# Patient Record
Sex: Female | Born: 1975 | Race: White | Hispanic: No | Marital: Single | Smoking: Never smoker
Health system: Southern US, Community
[De-identification: ages and names within clinical notes are randomized; demographics above are authoritative.]

## PROBLEM LIST (undated history)

## (undated) DIAGNOSIS — J302 Other seasonal allergic rhinitis: Secondary | ICD-10-CM

## (undated) HISTORY — PX: BRAIN SURGERY: SHX531

---

## 2013-01-28 ENCOUNTER — Emergency Department
Admission: EM | Admit: 2013-01-28 | Discharge: 2013-01-28 | Disposition: A | Discharge status: Home / Self Care | Payer: BC Managed Care – PPO | Source: Home / Self Care

## 2013-01-28 ENCOUNTER — Encounter: Payer: Self-pay | Admitting: Emergency Medicine

## 2013-01-28 DIAGNOSIS — H60399 Other infective otitis externa, unspecified ear: Secondary | ICD-10-CM

## 2013-01-28 DIAGNOSIS — H60391 Other infective otitis externa, right ear: Secondary | ICD-10-CM

## 2013-01-28 MED ORDER — NEOMYCIN-POLYMYXIN-HC 3.5-10000-1 OT SOLN: 3.0000 [drp] | Freq: Four times a day (QID) | OTIC | Status: AC

## 2013-01-28 NOTE — ED Provider Notes (Signed)
History     CSN: [id number]  Arrival date & time 01/28/13  1522   None     Chief Complaint  Patient presents with  . Ear Problem   HPI  Right ear discomfort x1 week. Patient said cessation of ear being clogged. No headache. Has had mild allergic/your eye symptoms. No neck stiffness. Patient is a nonsmoker. Has not been taking allergy medicine.  History reviewed. No pertinent past medical history.  Past Surgical History  Procedure Laterality Date  . Brain surgery      No family history on file.  History  Substance Use Topics  . Smoking status: Never Smoker   . Smokeless tobacco: Not on file  . Alcohol Use: No    OB History   Grav Para Term Preterm Abortions TAB SAB Ect Mult Living                  Review of Systems  All other systems reviewed and are negative.    Allergies  Review of patient's allergies indicates no known allergies.  Home Medications   Current Outpatient Rx  Name  Route  Sig  Dispense  Refill  . neomycin-polymyxin-hydrocortisone (CORTISPORIN) otic solution   Right Ear   Place 3 drops into the right ear 4 (four) times daily.   10 mL   0     BP 114/78  Pulse 92  Temp(Src) 97.3 F (36.3 C) (Oral)  Ht 5\' 5"  (1.651 m)  Wt 131 lb (59.421 kg)  BMI 21.8 kg/m2  SpO2 100%  LMP 01/15/2013  Physical Exam  Constitutional: She appears well-developed and well-nourished.  HENT:  Head: Normocephalic and atraumatic.  Left Ear: External ear normal.  R ear mild erythema and tenderness to otoscopic evaluation   Eyes: Pupils are equal, round, and reactive to light.  Neck: Normal range of motion.  Cardiovascular: Normal rate, regular rhythm and normal heart sounds.   Pulmonary/Chest: Effort normal.  Abdominal: Soft.  Musculoskeletal: Normal range of motion.  Lymphadenopathy:    She has no cervical adenopathy.  Neurological: She is alert.  Skin: Skin is warm.    ED Course  Procedures (including critical care time)  Labs Reviewed -  No data to display No results found.   1. Otitis, externa, infective, right       MDM  Will treat with cortisporin otic.  Discussed supportive care and ENT red flags.  Follow up with ENT if ear discomfort persists.  Start daily antihistamine for allergies.  Follow up as needed.      The patient and/or caregiver has been counseled thoroughly with regard to treatment plan and/or medications prescribed including dosage, schedule, interactions, rationale for use, and possible side effects and they verbalize understanding. Diagnoses and expected course of recovery discussed and will return if not improved as expected or if the condition worsens. Patient and/or caregiver verbalized understanding.             Doree Albee, MD 01/28/13 1556

## 2013-01-28 NOTE — ED Notes (Signed)
Rt ear, clogged x 1 week

## 2013-04-16 ENCOUNTER — Encounter: Payer: Self-pay | Admitting: Emergency Medicine

## 2013-04-16 ENCOUNTER — Emergency Department
Admission: EM | Admit: 2013-04-16 | Discharge: 2013-04-16 | Disposition: A | Discharge status: Home / Self Care | Payer: BC Managed Care – PPO | Source: Home / Self Care | Attending: Family Medicine | Admitting: Family Medicine

## 2013-04-16 DIAGNOSIS — H6980 Other specified disorders of Eustachian tube, unspecified ear: Secondary | ICD-10-CM

## 2013-04-16 DIAGNOSIS — H6981 Other specified disorders of Eustachian tube, right ear: Secondary | ICD-10-CM

## 2013-04-16 HISTORY — DX: Other seasonal allergic rhinitis: J30.2

## 2013-04-16 MED ORDER — PREDNISONE 20 MG PO TABS: 20.0000 mg | ORAL_TABLET | Freq: Two times a day (BID) | ORAL | Status: DC

## 2013-04-16 NOTE — ED Notes (Signed)
Reports sensation of right ear fullness; no pain; wondered if allergy related.

## 2013-04-16 NOTE — ED Provider Notes (Signed)
History     CSN: [id number]  Arrival date & time 04/16/13  1324   First MD Initiated Contact with Patient 04/16/13 [phone]      Chief Complaint  Patient presents with  . Ear Fullness       HPI Comments: Patient complains of persistent sensation of her right ear being clogged, without pain.  She has noticed decreased hearing in her right ear.  No recent URI symptoms.  No drainage from right ear.  No fevers, chills, and sweats  She has a history of seasonal allergies which are not symptomatic at present.  She has a history of recurrent right otitis media as a teen-ager.  Patient is a 37 y.o. female presenting with plugged ear sensation. The history is provided by the patient.  Ear Fullness This is a chronic problem. The current episode started more than 1 week ago. The problem occurs constantly. The problem has not changed since onset.Associated symptoms comments: none. Nothing aggravates the symptoms. Nothing relieves the symptoms. Treatments tried: antihistamine. The treatment provided no relief.    Past Medical History  Diagnosis Date  . Seasonal allergies     Past Surgical History  Procedure Laterality Date  . Brain surgery    . Brain surgery      History reviewed. No pertinent family history.  History  Substance Use Topics  . Smoking status: Never Smoker   . Smokeless tobacco: Not on file  . Alcohol Use: No    OB History   Grav Para Term Preterm Abortions TAB SAB Ect Mult Living                  Review of Systems  All other systems reviewed and are negative.    Allergies  Review of patient's allergies indicates no known allergies.  Home Medications   Current Outpatient Rx  Name  Route  Sig  Dispense  Refill  . predniSONE (DELTASONE) 20 MG tablet   Oral   Take 1 tablet (20 mg total) by mouth 2 (two) times daily. Take with food.   10 tablet   0     BP 98/65  Pulse 85  Temp(Src) 98 F (36.7 C) (Oral)  Resp 16  Ht 5\' 5"  (1.651 m)  Wt 128 lb (58.06  kg)  BMI 21.3 kg/m2  SpO2 100%  LMP 03/21/2013  Physical Exam Nursing notes and Vital Signs reviewed. Appearance:  Patient appears healthy, stated age, and in no acute distress Eyes:  Pupils are equal, round, and reactive to light and accomodation.  Extraocular movement is intact.  Conjunctivae are not inflamed  Ears:  Canals normal.  Right tympanic membrane is scarred but shows no evidence otitis media.  Left tympanic membrane normal Nose:  Mildly congested turbinates, worse on the right.  No sinus tenderness.  Pharynx:  Normal Neck:  Supple.  No adenopathy. Skin:  No rash present.    ED Course  Procedures  none  Labs Reviewed -  Tympanogram positive peak pressure left ear; normal right ear     1. Eustachian tube dysfunction, right       MDM   Begin prednisone burst. Take Sudafed for sinus congestion.  Increase fluid intake  May use Afrin nasal spray (or generic oxymetazoline) twice daily for about 5 days.  Also recommend using saline nasal spray several times daily and saline nasal irrigation (AYR is a common brand) Followup with ENT if not improved in one week.        Jeannett Senior  Jillyn Hidden, MD 04/16/13 [phone]

## 2015-09-17 ENCOUNTER — Encounter: Payer: Self-pay | Admitting: *Deleted

## 2015-09-17 ENCOUNTER — Emergency Department
Admission: EM | Admit: 2015-09-17 | Discharge: 2015-09-17 | Disposition: A | Discharge status: Home / Self Care | Payer: BLUE CROSS/BLUE SHIELD | Source: Home / Self Care | Attending: Family Medicine | Admitting: Family Medicine

## 2015-09-17 DIAGNOSIS — H01003 Unspecified blepharitis right eye, unspecified eyelid: Secondary | ICD-10-CM

## 2015-09-17 DIAGNOSIS — J029 Acute pharyngitis, unspecified: Secondary | ICD-10-CM | POA: Diagnosis not present

## 2015-09-17 MED ORDER — BACITRACIN-POLYMYXIN B 500-10000 UNIT/GM OP OINT: 1.0000 "application " | TOPICAL_OINTMENT | Freq: Two times a day (BID) | OPHTHALMIC | Status: DC

## 2015-09-17 NOTE — Discharge Instructions (Signed)
You may try warm compresses and benadryl to help with itching and redness. If symptoms not improving in 24-48 hours, you may start using the antibiotic ointment.     Blepharitis Blepharitis is inflammation of the eyelids. Blepharitis may happen with:  Reddish, scaly skin around the scalp and eyebrows.  Burning or itching of the eyelids.  Eye discharge at night that causes the eyelashes to stick together in the morning.  Eyelashes that fall out.  Sensitivity to light. HOME CARE INSTRUCTIONS Pay attention to any changes in how you look or feel. Follow these instructions to help with your condition: Keeping Clean  Wash your hands often.  Wash your eyelids with warm water or with warm water that is mixed with a small amount of baby shampoo. Do this two times per day or as often as needed.  Wash your face and eyebrows at least once a day.  Use a clean towel each time you dry your eyelids. Do not use this towel to clean or dry other areas of your body. Do not share your towel with anyone. General Instructions  Avoid wearing makeup until you get better. Do not share makeup with anyone.  Avoid rubbing your eyes.  Apply warm compresses to your eyes 2 times per day for 10 minutes at a time, or as told by your health care provider.  If you were prescribed an antibiotic ointment or steroid drops, apply or use the medicine as told by your health care provider. Do not stop using the medicine even if you feel better.  Keep all follow-up visits as told by your health care provider. This is important. SEEK MEDICAL CARE IF:  Your eyelids feel hot.  You have blisters or a rash on your eyelids.  The condition does not go away in 2-4 days.  The inflammation gets worse. SEEK IMMEDIATE MEDICAL CARE IF:  You have pain or redness that gets worse or spreads to other parts of your face.  Your vision changes.  You have pain when looking at lights or moving objects.  You have a fever.     This information is not intended to replace advice given to you by your health care provider. Make sure you discuss any questions you have with your health care provider.   Document Released: 10/21/2000 Document Revised: 07/15/2015 Document Reviewed: 02/16/2015 Elsevier Interactive Patient Education [employer]2016 [employer]

## 2015-09-17 NOTE — ED Provider Notes (Signed)
CSN: [id number]     Arrival date & time 09/17/15  1502 History   First MD Initiated Contact with Patient 09/17/15 1520     Chief Complaint  Patient presents with  . Eye Problem   (Consider location/radiation/quality/duration/timing/severity/associated sxs/prior Treatment) HPI Pt is a 39yo female presenting to [facility] with c/o Right lower eyelid soreness with mild redness and swelling this morning.  She denies injury to her eye. She has had a mild intermittent sore throat with mild congestion for 2-3 days.  Pt states she has children who have also been sick recently. Denies fever, chills, n/v/d. Denies change in vision. She does not wear contacts.   Past Medical History  Diagnosis Date  . Seasonal allergies    Past Surgical History  Procedure Laterality Date  . Brain surgery    . Brain surgery     History reviewed. No pertinent family history. Social History  Substance Use Topics  . Smoking status: Never Smoker   . Smokeless tobacco: None  . Alcohol Use: No   OB History    No data available     Review of Systems  Constitutional: Negative for fever and chills.  HENT: Positive for congestion and sore throat. Negative for ear discharge, ear pain, trouble swallowing and voice change.   Eyes: Positive for pain, discharge, redness and itching. Negative for photophobia and visual disturbance.       Right lower eyelid  Respiratory: Negative for cough and shortness of breath.   Gastrointestinal: Negative for nausea, vomiting, abdominal pain and diarrhea.  Neurological: Negative for dizziness, light-headedness and headaches.    Allergies  Review of patient's allergies indicates no known allergies.  Home Medications   Prior to Admission medications   Medication Sig Start Date End Date Taking? Authorizing Provider  bacitracin-polymyxin b (POLYSPORIN) ophthalmic ointment Place 1 application into the right eye every 12 (twelve) hours. apply to Right lower eyelid every 12 hours while  awake for 5 days 09/17/15   Junius Finner, PA-C   Meds Ordered and Administered this Visit  Medications - No data to display  BP 144/78 mmHg  Pulse 100  Temp(Src) 98.1 F (36.7 C) (Oral)  Resp 14  Wt 139 lb (63.05 kg)  SpO2 100%  LMP 08/27/2015 No data found.   Physical Exam  Constitutional: She is oriented to person, place, and time. She appears well-developed and well-nourished.  HENT:  Head: Normocephalic and atraumatic.  Right Ear: Hearing, tympanic membrane, external ear and ear canal normal.  Left Ear: Hearing, tympanic membrane, external ear and ear canal normal.  Nose: Nose normal.  Mouth/Throat: Uvula is midline, oropharynx is clear and moist and mucous membranes are normal.  Eyes: Conjunctivae and EOM are normal. Pupils are equal, round, and reactive to light. Lids are everted and swept, no foreign bodies found. Right eye exhibits discharge. Right eye exhibits no hordeolum.  Right lower eyelid: mild erythema and edema, scant white discharge in medial canthus. Minimal tenderness. No bleeding.  EOM normal. Normal conjunctiva.  Neck: Normal range of motion. Neck supple.  Cardiovascular: Normal rate.   Pulmonary/Chest: Effort normal.  Musculoskeletal: Normal range of motion.  Neurological: She is alert and oriented to person, place, and time.  Skin: Skin is warm and dry.  Psychiatric: She has a normal mood and affect. Her behavior is normal.  Nursing note and vitals reviewed.   ED Course  Procedures (including critical care time)  Labs Review Labs Reviewed - No data to display  Imaging Review No  results found.   Visual Acuity Review  Right Eye Distance: 20/20 Left Eye Distance: 20/40 Bilateral Distance: 20/20 (glasses)    MDM   1. Blepharitis of right eyelid   2. Sore throat    Pt is a 39yo female [facility] c/o Right lower eyelid irritation. Exam c/w early onset blepharitis.  No evidence of periorbital cellulitis at this time. Encouraged  warm compresses and taking Benadryl to see if symptoms improve in 24-48 hours. If not improving, or symptoms worsening, she may use topical antibiotic. Rx: polysporin ophthalmic ointment Home care instructions provided. F/u with PCP in 4-5 days, sooner if worsening. Patient verbalized understanding and agreement with treatment plan.  Junius Finnerrin O'Malley, PA-C 09/17/15 1549

## 2015-09-17 NOTE — ED Notes (Signed)
Pt c/o awakening with right eye soreness and swelling x this AM. Denies any drainage.

## 2015-09-19 ENCOUNTER — Encounter: Payer: Self-pay | Admitting: Emergency Medicine

## 2015-09-19 ENCOUNTER — Emergency Department
Admission: EM | Admit: 2015-09-19 | Discharge: 2015-09-19 | Disposition: A | Discharge status: Home / Self Care | Payer: BLUE CROSS/BLUE SHIELD | Source: Home / Self Care | Attending: Family Medicine | Admitting: Family Medicine

## 2015-09-19 DIAGNOSIS — B9789 Other viral agents as the cause of diseases classified elsewhere: Principal | ICD-10-CM

## 2015-09-19 DIAGNOSIS — J069 Acute upper respiratory infection, unspecified: Secondary | ICD-10-CM | POA: Diagnosis not present

## 2015-09-19 LAB — POCT RAPID STREP A (OFFICE): RAPID STREP A SCREEN: NEGATIVE

## 2015-09-19 MED ORDER — AZITHROMYCIN 250 MG PO TABS: ORAL_TABLET | ORAL | Status: DC

## 2015-09-19 MED ORDER — PREDNISOLONE SODIUM PHOSPHATE 15 MG/5ML PO SOLN: ORAL | Status: DC

## 2015-09-19 NOTE — ED Notes (Signed)
Reports sore throat and hoarseness for past 48 hours. No OTC today.

## 2015-09-19 NOTE — Discharge Instructions (Signed)
Take plain guaifenesin (such as Robitussin syrup, or1200mg  extended release tabs such as Mucinex) twice daily, with plenty of water, for cough and congestion.  May add Pseudoephedrine for sinus congestion.  Get adequate rest.   May use Afrin nasal spray (or generic oxymetazoline) twice daily for about 5 days and then discontinue.  Also recommend using saline nasal spray several times daily and saline nasal irrigation (AYR is a common brand).   Try warm salt water gargles for sore throat.  Stop all antihistamines for now, and other non-prescription cough/cold preparations.  May take Delsym Cough Suppressant at bedtime for nighttime cough.  Begin Azithromycin if not improving about one week or if persistent fever develops   Follow-up with family doctor if not improving about10 days.

## 2015-09-19 NOTE — ED Provider Notes (Signed)
CSN: [id number]     Arrival date & time 09/19/15  1208 History   First MD Initiated Contact with Patient 09/19/15 1248     Chief Complaint  Patient presents with  . Sore Throat  . Hoarse      HPI Comments: Three days ago patient developed a sore throat, post nasal drainage, and fatigue.  Yesterday she became hoarse and developed a cough.  Today she developed headache.  No fevers, chills, and sweats.  The history is provided by the patient.    Past Medical History  Diagnosis Date  . Seasonal allergies    Past Surgical History  Procedure Laterality Date  . Brain surgery    . Brain surgery     History reviewed. No pertinent family history. Social History  Substance Use Topics  . Smoking status: Never Smoker   . Smokeless tobacco: None  . Alcohol Use: No   OB History    No data available     Review of Systems + sore throat + hoarse + cough No pleuritic pain No wheezing No nasal congestion + post-nasal drainage No sinus pain/pressure No itchy/red eyes ? earache No hemoptysis No SOB No fever/chills No nausea No vomiting No abdominal pain No diarrhea No urinary symptoms No skin rash + fatigue No myalgias + headache    Allergies  Review of patient's allergies indicates no known allergies.  Home Medications   Prior to Admission medications   Medication Sig Start Date End Date Taking? Authorizing Provider  azithromycin (ZITHROMAX Z-PAK) 250 MG tablet Take 2 tabs today; then begin one tab once daily for 4 more days. (Rx void after 09/27/15) 09/19/15   Lattie HawStephen A Beese, MD  prednisoLONE (ORAPRED) 15 MG/5ML solution Take 5mL by mouth twice daily for 5 days. 09/19/15   Lattie HawStephen A Beese, MD   Meds Ordered and Administered this Visit  Medications - No data to display  BP 119/78 mmHg  Pulse 125  Temp(Src) 100.8 F (38.2 C) (Oral)  Ht 5\' 9"  (1.753 m)  Wt 139 lb (63.05 kg)  BMI 20.52 kg/m2  SpO2 98%  LMP 08/27/2015 No data found.   Physical Exam Nursing  notes and Vital Signs reviewed. Appearance:  Patient appears stated age, and in no acute distress Eyes:  Pupils are equal, round, and reactive to light and accomodation.  Extraocular movement is intact.  Conjunctivae are not inflamed  Ears:  Canals normal.  Tympanic membranes normal.  Nose:  Mildly congested turbinates.  No sinus tenderness.    Pharynx:  Mildly erythematous Neck:  Supple.  Tender enlarged posterior nodes are palpated bilaterally  Lungs:  Clear to auscultation.  Breath sounds are equal.  Moving air well. Heart:  Regular rate and rhythm without murmurs, rubs, or gallops.  Abdomen:  Nontender without masses or hepatosplenomegaly.  Bowel sounds are present.  No CVA or flank tenderness.  Extremities:  No edema.  No calf tenderness Skin:  No rash present.   ED Course  Procedures none    Labs Reviewed  STREP A DNA PROBE  POCT RAPID STREP A (OFFICE) negative     MDM   1. Viral URI with cough    There is no evidence of bacterial infection today.  Throat culture pending. Begin prednisone burst. Take plain guaifenesin (such as Robitussin syrup, or1200mg  extended release tabs such as Mucinex) twice daily, with plenty of water, for cough and congestion.  May add Pseudoephedrine for sinus congestion.  Get adequate rest.   May use Afrin  nasal spray (or generic oxymetazoline) twice daily for about 5 days and then discontinue.  Also recommend using saline nasal spray several times daily and saline nasal irrigation (AYR is a common brand).   Try warm salt water gargles for sore throat.  Stop all antihistamines for now, and other non-prescription cough/cold preparations.  May take Delsym Cough Suppressant at bedtime for nighttime cough.  Begin Azithromycin if not improving about one week or if persistent fever develops (Given a prescription to hold, with an expiration date)  Follow-up with family doctor if not improving about10 days.     Lattie Haw, MD 09/24/15 [phone]

## 2015-09-20 LAB — STREP A DNA PROBE: GASP: NEGATIVE

## 2015-09-22 ENCOUNTER — Telehealth: Payer: Self-pay | Admitting: *Deleted

## 2020-07-08 HISTORY — PX: THYROID SURGERY: SHX805

## 2020-09-18 ENCOUNTER — Other Ambulatory Visit: Payer: Self-pay

## 2020-09-18 ENCOUNTER — Emergency Department
Admission: EM | Admit: 2020-09-18 | Discharge: 2020-09-18 | Disposition: A | Discharge status: Home / Self Care | Payer: BLUE CROSS/BLUE SHIELD | Source: Home / Self Care | Attending: Family Medicine | Admitting: Family Medicine

## 2020-09-18 ENCOUNTER — Emergency Department (INDEPENDENT_AMBULATORY_CARE_PROVIDER_SITE_OTHER): Payer: BLUE CROSS/BLUE SHIELD

## 2020-09-18 DIAGNOSIS — M79604 Pain in right leg: Secondary | ICD-10-CM | POA: Diagnosis not present

## 2020-09-18 DIAGNOSIS — M545 Low back pain, unspecified: Secondary | ICD-10-CM | POA: Diagnosis not present

## 2020-09-18 DIAGNOSIS — M47816 Spondylosis without myelopathy or radiculopathy, lumbar region: Secondary | ICD-10-CM

## 2020-09-18 MED ORDER — PREDNISONE 20 MG PO TABS: ORAL_TABLET | ORAL | 0 refills | Status: AC

## 2020-09-18 NOTE — ED Triage Notes (Signed)
Pt c/o RT sided lower back pain as well as cramping in RT leg, currently behind RT knee. Last night states that it cramped all night. Ibuprofen prn. Pain 2/10

## 2020-09-18 NOTE — Discharge Instructions (Signed)
Take one prednisone tablet today. Apply ice pack to right lower back for 20 to 30 minutes, 3 to 4 times daily  Continue until pain and swelling decrease.  After finishing prednisone, may take Ibuprofen 200mg , 4 tabs every 8 hours with food.

## 2020-09-18 NOTE — ED Provider Notes (Signed)
[facility]    CSN: [id number] Arrival date & time: 09/18/20  1258      History   Chief Complaint Chief Complaint  Patient presents with  . Back Pain  . Leg cramps    RT    HPI Alyssa Davis is a 44 y.o. female.   Patient has a long history of recurring right lower back pain.  Last night she developed cramping pain radiating into her right posterior leg.  She denies injury but attributes her pain to recently returning to work.  She denies bowel or bladder dysfunction, and no saddle numbness.   The history is provided by the patient.  Back Pain Location:  Lumbar spine Quality:  Aching Radiates to:  R posterior upper leg Pain severity:  Mild Pain is:  Same all the time Onset quality:  Sudden Duration:  1 day Timing:  Constant Progression:  Unchanged Chronicity:  Recurrent Context: lifting heavy objects   Context: not recent injury   Relieved by:  Nothing Worsened by:  Bending, movement and ambulation Ineffective treatments:  NSAIDs Associated symptoms: no abdominal pain, no bladder incontinence, no bowel incontinence, no dysuria, no fever, no leg pain, no numbness, no paresthesias, no pelvic pain, no perianal numbness, no tingling and no weakness     Past Medical History:  Diagnosis Date  . Seasonal allergies     There are no problems to display for this patient.   Past Surgical History:  Procedure Laterality Date  . BRAIN SURGERY    . BRAIN SURGERY    . THYROID SURGERY  07/2020    OB History   No obstetric history on file.      Home Medications    Prior to Admission medications   Medication Sig Start Date End Date Taking? Authorizing Provider  azelastine (ASTELIN) 0.1 % nasal spray Place into the nose. 01/06/20  Yes [provider]  loratadine (CLARITIN) 10 MG tablet  03/08/19  Yes [provider]  azithromycin (ZITHROMAX Z-PAK) 250 MG tablet Take 2 tabs today; then begin one tab once daily for 4 more days. (Rx void  after 09/27/15) 09/19/15   Lattie Haw, MD  Multiple Vitamin (MULTIVITAMIN) capsule Take 1 capsule by mouth daily.    [provider]  omeprazole (PRILOSEC) 20 MG capsule Take by mouth.    [provider]  predniSONE (DELTASONE) 20 MG tablet Take one tab by mouth twice daily for 4 days, then one daily. Take with food. 09/18/20   Lattie Haw, MD    Family History History reviewed. No pertinent family history.  Social History Social History   Tobacco Use  . Smoking status: Never Smoker  Vaping Use  . Vaping Use: Never used  Substance Use Topics  . Alcohol use: No  . Drug use: No     Allergies   Acetaminophen   Review of Systems Review of Systems  Constitutional: Positive for activity change. Negative for chills, diaphoresis, fatigue and fever.  Gastrointestinal: Negative for abdominal pain and bowel incontinence.  Genitourinary: Negative for bladder incontinence, dysuria, flank pain and pelvic pain.  Musculoskeletal: Positive for back pain.  Skin: Negative for rash.  Neurological: Negative for tingling, weakness, numbness and paresthesias.  All other systems reviewed and are negative.    Physical Exam Triage Vital Signs ED Triage Vitals  Enc Vitals Group     BP 09/18/20 1315 (!) 148/84     Pulse Rate 09/18/20 1315 99     Resp 09/18/20 1315  18     Temp 09/18/20 1315 98.2 F (36.8 C)     Temp Source 09/18/20 1315 Oral     SpO2 09/18/20 1315 97 %     Weight --      Height --      Head Circumference --      Peak Flow --      Pain Score 09/18/20 1316 2     Pain Loc --      Pain Edu? --      Excl. in GC? --    No data found.  Updated Vital Signs BP (!) 148/84 (BP Location: Right Arm)   Pulse 99   Temp 98.2 F (36.8 C) (Oral)   Resp 18   LMP 09/14/2020   SpO2 97%   Visual Acuity Right Eye Distance:   Left Eye Distance:   Bilateral Distance:    Right Eye Near:   Left Eye Near:    Bilateral Near:     Physical Exam Vitals  and nursing note reviewed.  Constitutional:      General: She is not in acute distress. HENT:     Head: Normocephalic.     Nose: Nose normal.     Mouth/Throat:     Pharynx: Oropharynx is clear.  Eyes:     Conjunctiva/sclera: Conjunctivae normal.     Pupils: Pupils are equal, round, and reactive to light.  Cardiovascular:     Rate and Rhythm: Normal rate and regular rhythm.     Heart sounds: Normal heart sounds.  Pulmonary:     Breath sounds: Normal breath sounds.  Abdominal:     Tenderness: There is no abdominal tenderness.  Musculoskeletal:     Cervical back: Normal range of motion.       Back:     Right lower leg: No edema.     Left lower leg: No edema.     Comments: Back:  Range of motion relatively well preserved.  Can heel/toe walk and squat without difficulty. Tenderness in the midline and right paraspinous muscles from L5 to Sacral area.  Straight leg raising test is positive at about 10 degrees from horizontal.  Sitting knee extension test is negative.  Strength and sensation in the lower extremities is normal.  Patellar and achilles reflexes are normal   Skin:    General: Skin is warm and dry.     Findings: Rash present.  Neurological:     Mental Status: She is alert and oriented to person, place, and time.      UC Treatments / Results  Labs (all labs ordered are listed, but only abnormal results are displayed) Labs Reviewed - No data to display  EKG   Radiology DG Lumbar Spine Complete  Result Date: 09/18/2020 CLINICAL DATA:  Right lower back pain, right lower extremity radiculopathy EXAM: LUMBAR SPINE - COMPLETE 4+ VIEW COMPARISON:  None. FINDINGS: Frontal, bilateral oblique, lateral views of the lumbar spine are obtained. There are 5 non-rib-bearing lumbar type vertebral bodies in anatomic alignment. No fractures. There is mild disc space narrowing at L5/S1. Sacroiliac joints are normal. IMPRESSION: 1. Minimal spondylosis at L5/S1.  No acute bony abnormality.  Electronically Signed   By: Sharlet Salina M.D.   On: 09/18/2020 16:08    Procedures Procedures (including critical care time)  Medications Ordered in UC Medications - No data to display  Initial Impression / Assessment and Plan / UC Course  I have reviewed the triage vital signs and the nursing notes.  Pertinent labs & imaging results that were available during my care of the patient were reviewed by me and considered in my medical decision making (see chart for details).    Begin prednisone burst/taper. Followup with Dr. Rodney [facility] ([facility]) if not improving about two weeks.    Final Clinical Impressions(s) / UC Diagnoses   Final diagnoses:  Low back pain radiating to right lower extremity     Discharge Instructions     Take one prednisone tablet today. Apply ice pack to right lower back for 20 to 30 minutes, 3 to 4 times daily  Continue until pain and swelling decrease.  After finishing prednisone, may take Ibuprofen 200mg , 4 tabs every 8 hours with food.     ED Prescriptions    Medication Sig Dispense Auth. Provider   predniSONE (DELTASONE) 20 MG tablet Take one tab by mouth twice daily for 4 days, then one daily. Take with food. 12 tablet , MD        Lattie Haw, MD 09/20/20 [id number]

## 2022-03-04 IMAGING — DX DG LUMBAR SPINE COMPLETE 4+V
5 series · 5 of 5 positions shown · non-contrast
Comparison: None.

CLINICAL DATA: Right lower back pain, right lower extremity
radiculopathy

EXAM:
LUMBAR SPINE - COMPLETE 4+ VIEW

[l-spine ap]
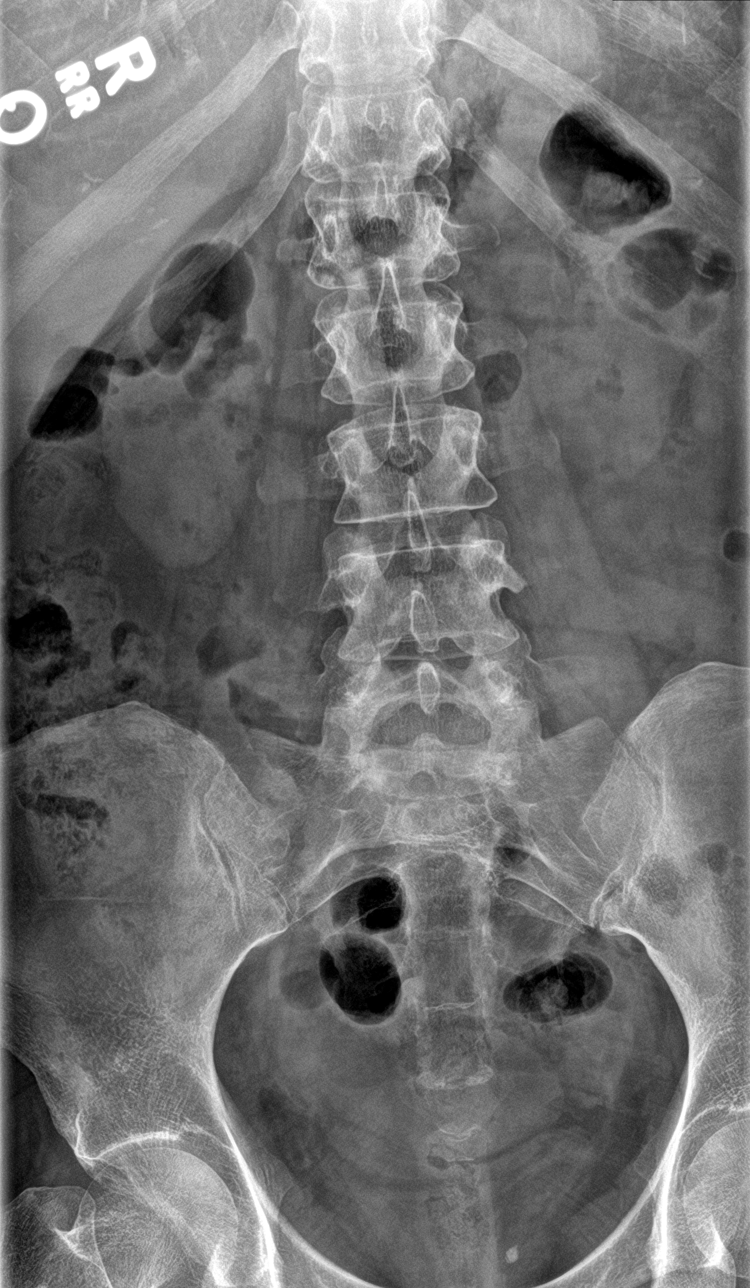

[l-spine obl (1 of 2)]
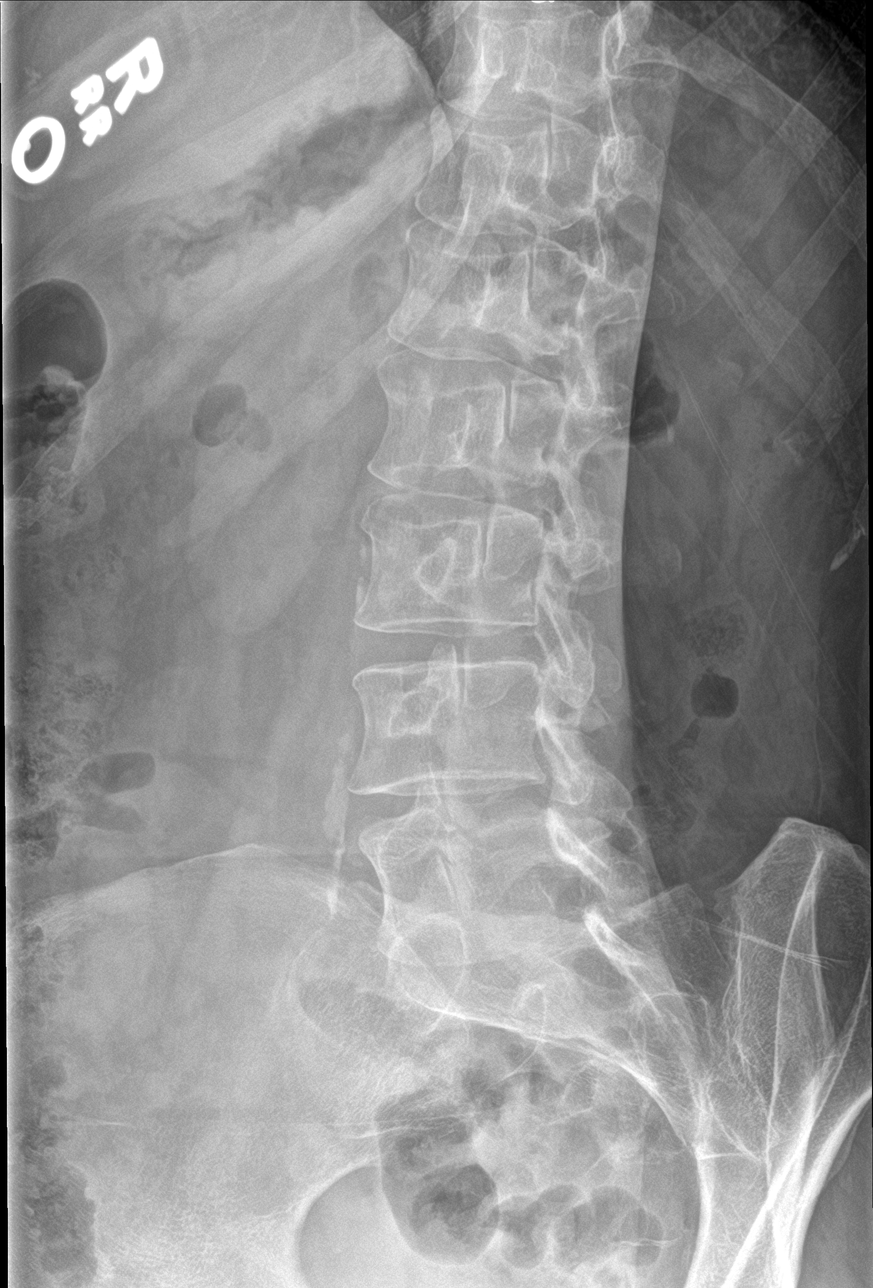

[l-spine obl (2 of 2)]
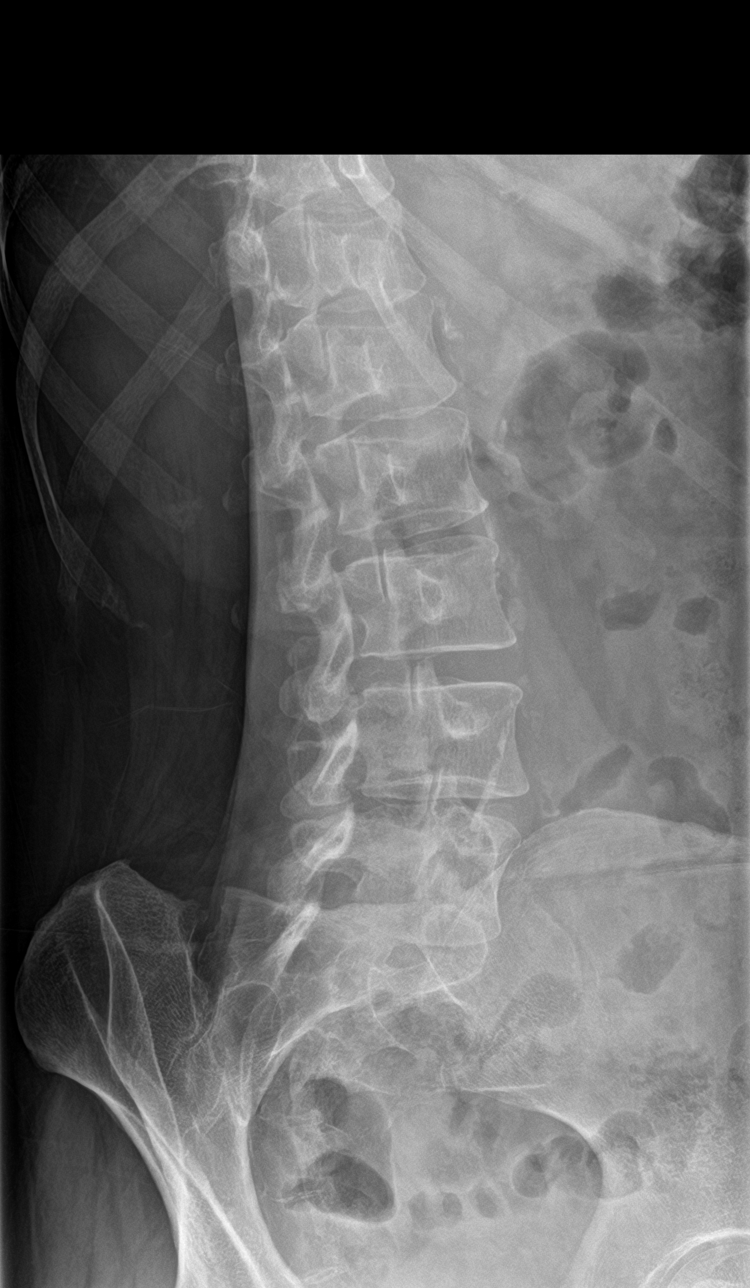

[l-spine lat]
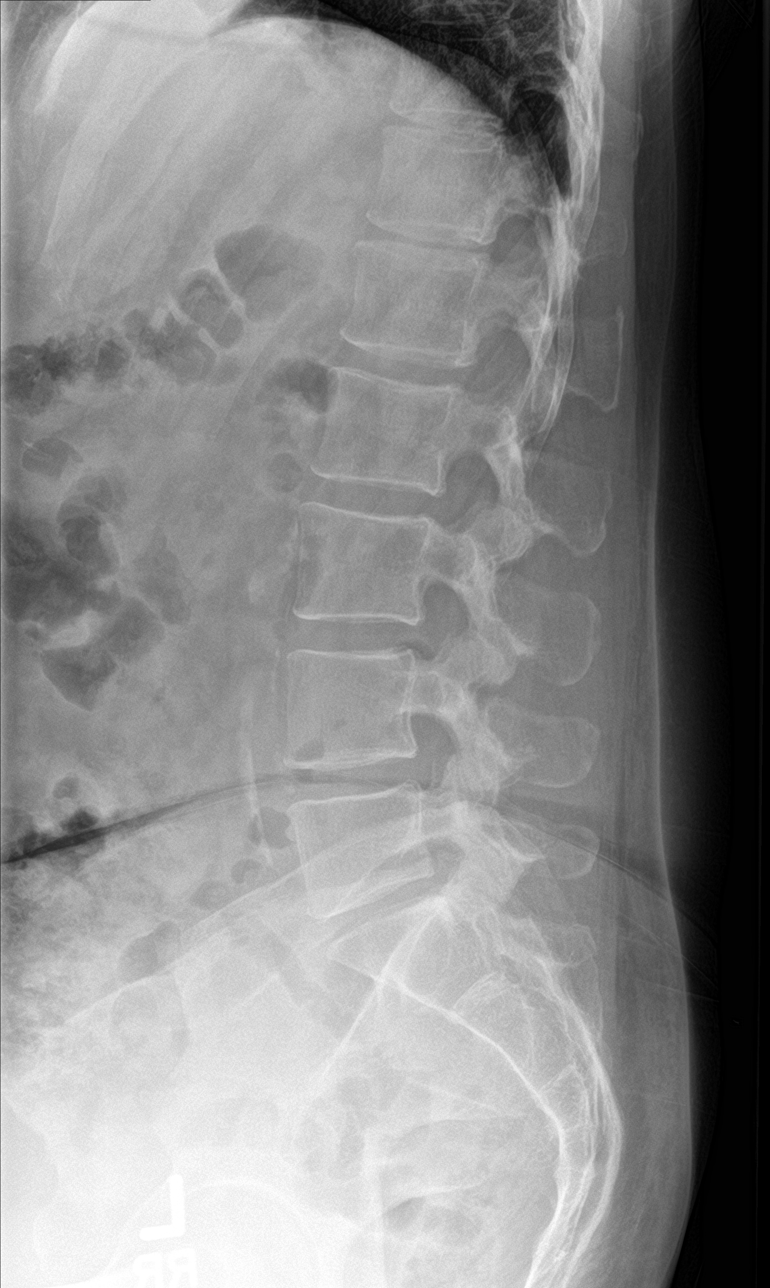

[l-spine spot]
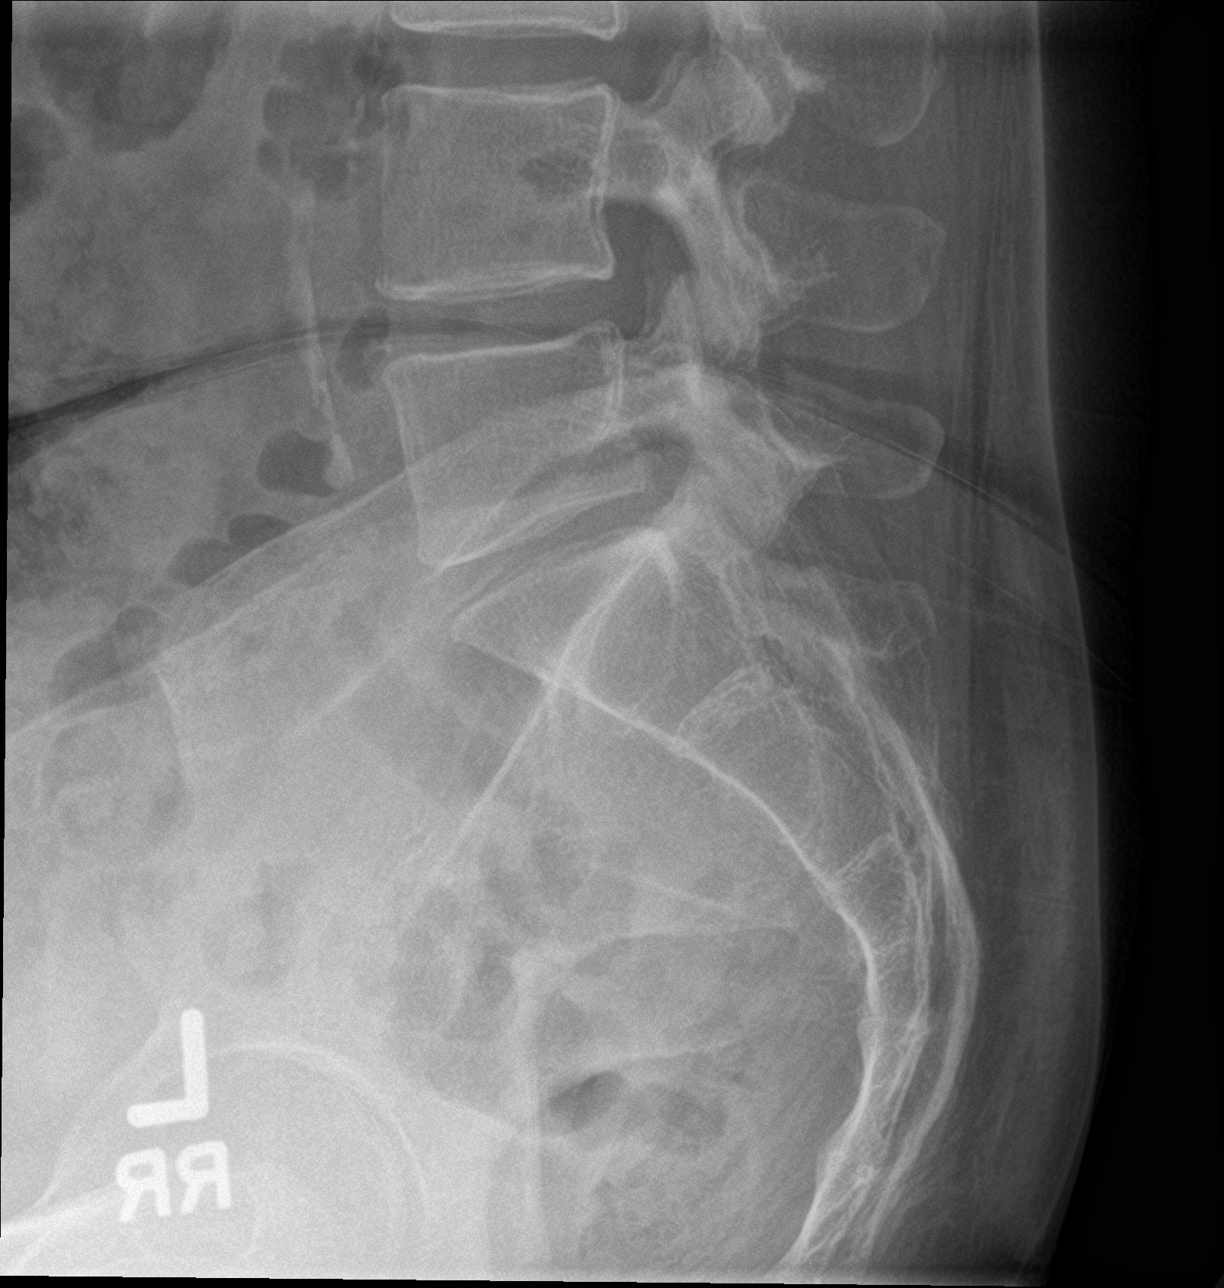

[5 of 5 positions shown; findings below may reference images not displayed]

FINDINGS: Frontal, bilateral oblique, lateral views of the lumbar spine are
obtained. There are 5 non-rib-bearing lumbar type vertebral bodies
in anatomic alignment. No fractures. There is mild disc space
narrowing at L5/S1. Sacroiliac joints are normal.
IMPRESSION: 1. Minimal spondylosis at L5/S1.  No acute bony abnormality.
